# Patient Record
Sex: Female | Born: 1980 | Race: White | Hispanic: No | Marital: Single | Smoking: Never smoker
Health system: Southern US, Community
[De-identification: ages and names within clinical notes are randomized; demographics above are authoritative.]

## PROBLEM LIST (undated history)

## (undated) DIAGNOSIS — N289 Disorder of kidney and ureter, unspecified: Secondary | ICD-10-CM

---
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
## 2001-12-01 ENCOUNTER — Ambulatory Visit (HOSPITAL_BASED_OUTPATIENT_CLINIC_OR_DEPARTMENT_OTHER): Admission: RE | Admit: 2001-12-01 | Discharge: 2001-12-01 | Payer: Self-pay | Admitting: Otolaryngology

## 2002-04-20 ENCOUNTER — Other Ambulatory Visit: Admission: RE | Admit: 2002-04-20 | Discharge: 2002-04-20 | Payer: Self-pay | Admitting: Obstetrics and Gynecology

## 2002-07-05 ENCOUNTER — Ambulatory Visit (HOSPITAL_COMMUNITY): Admission: AD | Admit: 2002-07-05 | Discharge: 2002-07-05 | Payer: Self-pay | Admitting: Obstetrics and Gynecology

## 2002-07-05 ENCOUNTER — Encounter: Payer: Self-pay | Admitting: Obstetrics and Gynecology

## 2004-05-08 ENCOUNTER — Other Ambulatory Visit: Admission: RE | Admit: 2004-05-08 | Discharge: 2004-05-08 | Payer: Self-pay | Admitting: Obstetrics and Gynecology

## 2005-06-21 ENCOUNTER — Other Ambulatory Visit: Admission: RE | Admit: 2005-06-21 | Discharge: 2005-06-21 | Payer: Self-pay | Admitting: Obstetrics and Gynecology

## 2018-11-26 ENCOUNTER — Other Ambulatory Visit: Payer: Self-pay | Admitting: Family Medicine

## 2018-11-26 DIAGNOSIS — R1010 Upper abdominal pain, unspecified: Secondary | ICD-10-CM

## 2019-01-07 ENCOUNTER — Emergency Department (HOSPITAL_COMMUNITY)
Admission: EM | Admit: 2019-01-07 | Discharge: 2019-01-08 | Disposition: A | Discharge status: Home / Self Care | Payer: Managed Care, Other (non HMO) | Attending: Emergency Medicine | Admitting: Emergency Medicine

## 2019-01-07 ENCOUNTER — Encounter: Payer: Self-pay | Admitting: Emergency Medicine

## 2019-01-07 ENCOUNTER — Other Ambulatory Visit: Payer: Self-pay

## 2019-01-07 DIAGNOSIS — N200 Calculus of kidney: Secondary | ICD-10-CM | POA: Insufficient documentation

## 2019-01-07 DIAGNOSIS — Z79899 Other long term (current) drug therapy: Secondary | ICD-10-CM | POA: Diagnosis not present

## 2019-01-07 DIAGNOSIS — R109 Unspecified abdominal pain: Secondary | ICD-10-CM | POA: Diagnosis present

## 2019-01-07 HISTORY — DX: Disorder of kidney and ureter, unspecified: N28.9

## 2019-01-07 LAB — CBC WITH DIFFERENTIAL/PLATELET
Abs Immature Granulocytes: 0.05 10*3/uL (ref 0.00–0.07)
Basophils Absolute: 0 10*3/uL (ref 0.0–0.1)
Basophils Relative: 0 %
Eosinophils Absolute: 0.1 10*3/uL (ref 0.0–0.5)
Eosinophils Relative: 1 %
HCT: 43.6 % (ref 36.0–46.0)
Hemoglobin: 14.9 g/dL (ref 12.0–15.0)
Immature Granulocytes: 0 %
Lymphocytes Relative: 15 %
Lymphs Abs: 2.2 10*3/uL (ref 0.7–4.0)
MCH: 35.6 pg — ABNORMAL HIGH (ref 26.0–34.0)
MCHC: 34.2 g/dL (ref 30.0–36.0)
MCV: 104.1 fL — ABNORMAL HIGH (ref 80.0–100.0)
Monocytes Absolute: 1 10*3/uL (ref 0.1–1.0)
Monocytes Relative: 7 %
Neutro Abs: 10.8 10*3/uL — ABNORMAL HIGH (ref 1.7–7.7)
Neutrophils Relative %: 77 %
Platelets: 279 10*3/uL (ref 150–400)
RBC: 4.19 MIL/uL (ref 3.87–5.11)
RDW: 12.7 % (ref 11.5–15.5)
WBC: 14.1 10*3/uL — ABNORMAL HIGH (ref 4.0–10.5)
nRBC: 0 % (ref 0.0–0.2)

## 2019-01-07 LAB — URINALYSIS, ROUTINE W REFLEX MICROSCOPIC
Bilirubin Urine: NEGATIVE
Glucose, UA: NEGATIVE mg/dL
Ketones, ur: NEGATIVE mg/dL
Leukocytes,Ua: NEGATIVE
Nitrite: NEGATIVE
Protein, ur: NEGATIVE mg/dL
RBC / HPF: >50 RBC/hpf — ABNORMAL HIGH (ref 0–5)
Specific Gravity, Urine: 1.023 (ref 1.005–1.030)
pH: 7 (ref 5.0–8.0)

## 2019-01-07 LAB — PREGNANCY, URINE: Preg Test, Ur: NEGATIVE

## 2019-01-07 MED ORDER — KETOROLAC TROMETHAMINE 30 MG/ML IJ SOLN
30.0000 mg | Freq: Once | INTRAMUSCULAR | Status: AC
  Administered 2019-01-07: 30 mg via INTRAVENOUS
  Filled 2019-01-07: qty 1

## 2019-01-07 MED ORDER — HYDROMORPHONE HCL 1 MG/ML IJ SOLN
1.0000 mg | Freq: Once | INTRAMUSCULAR | Status: AC
  Administered 2019-01-07: 1 mg via INTRAVENOUS
  Filled 2019-01-07: qty 1

## 2019-01-07 NOTE — ED Triage Notes (Signed)
Pt complains of right flank pain since noon, she has hx of kidney stones, she tried her meds at home with no relief

## 2019-01-07 NOTE — ED Provider Notes (Signed)
[facility] DEPT Provider Note   CSN: [id number] Arrival date & time: 01/07/19  2105     History   Chief Complaint Chief Complaint  Patient presents with  . Flank Pain    HPI Rachel Monroe is a 38 y.o. female.     Patient presents to the emergency department with a chief complaint of flank pain.  Patient reports history of kidney stones.  She states that her pain began suddenly today at around noon.  She states pain is severe.  It radiates from her right flank to her right lower abdomen.  She reports associated hematuria and urinary hesitancy.  She denies any fevers, chills. She reports associated nausea and vomiting.  Nothing makes his symptoms better or worse.  She has never required intervention for kidney stones in the past.  The history is provided by the patient. No language interpreter was used.    Past Medical History:  Diagnosis Date  . Renal disorder     There are no active problems to display for this patient.   History reviewed. No pertinent surgical history.   OB History   No obstetric history on file.      Home Medications    Prior to Admission medications   Medication Sig Start Date End Date Taking? Authorizing Provider  Multiple Vitamin (MULTIVITAMIN WITH MINERALS) TABS tablet Take 1 tablet by mouth daily.   Yes [provider]  oxyCODONE (ROXICODONE) 5 MG immediate release tablet Take 5-10 mg by mouth every 4 (four) hours as needed for moderate pain or severe pain.   Yes [provider]    Family History History reviewed. No pertinent family history.  Social History Social History   Tobacco Use  . Smoking status: Never Smoker  . Smokeless tobacco: Never Used  Substance Use Topics  . Alcohol use: Never    Frequency: Never  . Drug use: Never     Allergies   Patient has no known allergies.   Review of Systems Review of Systems  All other systems reviewed and are negative.     Physical Exam Updated Vital Signs BP (!) 132/99 (BP Location: Left Arm)   Pulse 88   Resp 20   LMP 12/27/2018   SpO2 95%   Physical Exam Vitals signs and nursing note reviewed.  Constitutional:      General: She is not in acute distress.    Appearance: She is well-developed.  HENT:     Head: Normocephalic and atraumatic.  Eyes:     Conjunctiva/sclera: Conjunctivae normal.  Neck:     Musculoskeletal: Neck supple.  Cardiovascular:     Rate and Rhythm: Normal rate and regular rhythm.     Heart sounds: No murmur.  Pulmonary:     Effort: Pulmonary effort is normal. No respiratory distress.     Breath sounds: Normal breath sounds.  Abdominal:     Palpations: Abdomen is soft.     Tenderness: There is no abdominal tenderness.  Skin:    General: Skin is warm and dry.  Neurological:     Mental Status: She is alert and oriented to person, place, and time.  Psychiatric:        Mood and Affect: Mood normal.        Behavior: Behavior normal.      ED Treatments / Results  Labs (all labs ordered are listed, but only abnormal results are displayed) Labs Reviewed  URINALYSIS, ROUTINE W REFLEX MICROSCOPIC - Abnormal; Notable for  the following components:      Result Value   APPearance HAZY (*)    Hgb urine dipstick MODERATE (*)    RBC / HPF >50 (*)    Bacteria, UA RARE (*)    All other components within normal limits  PREGNANCY, URINE  CBC WITH DIFFERENTIAL/PLATELET  BASIC METABOLIC PANEL    EKG None  Radiology No results found.  Procedures Procedures (including critical care time)  Medications Ordered in ED Medications  ketorolac (TORADOL) 30 MG/ML injection 30 mg (has no administration in time range)  HYDROmorphone (DILAUDID) injection 1 mg (has no administration in time range)     Initial Impression / Assessment and Plan / ED Course  I have reviewed the triage vital signs and the nursing notes.  Pertinent labs & imaging results that were available during my  care of the patient were reviewed by me and considered in my medical decision making (see chart for details).        Patient with history of kidney stones, reports right-sided flank pain, onset around noon today.  Urinalysis shows gross hematuria.  High suspicion for kidney stone.  Will check creatinine and CT renal.  Will treat pain.  Will reassess.  CT shows an 8 mm UVJ stone with forniceal rupture and some perinephric fluid and hydro-ureteronephrosis.  I discussed the case with Dr. Mena GoesEskridge, who recommends outpatient follow-up.  Will discharge home with pain medicine and antibiotics due to her leukocytosis.  As discussed with Dr. Nicanor AlconPalumbo, who agrees with the plan.  Final Clinical Impressions(s) / ED Diagnoses   Final diagnoses:  Kidney stone    ED Discharge Orders         Ordered    HYDROcodone-acetaminophen (NORCO/VICODIN) 5-325 MG tablet  Every 6 hours PRN     01/08/19 0142    ondansetron (ZOFRAN ODT) 4 MG disintegrating tablet  Every 8 hours PRN     01/08/19 0142    cephALEXin (KEFLEX) 500 MG capsule  2 times daily     01/08/19 0142           Roxy HorsemanBrowning, Christalynn [place]ise, PA-C 01/08/19 0143    Palumbo, April, MD 01/08/19 [id number]

## 2019-01-08 ENCOUNTER — Emergency Department (HOSPITAL_COMMUNITY): Payer: Managed Care, Other (non HMO)

## 2019-01-08 LAB — BASIC METABOLIC PANEL
Anion gap: 16 — ABNORMAL HIGH (ref 5–15)
BUN: 24 mg/dL — ABNORMAL HIGH (ref 6–20)
CO2: 25 mmol/L (ref 22–32)
Calcium: 9.7 mg/dL (ref 8.9–10.3)
Chloride: 97 mmol/L — ABNORMAL LOW (ref 98–111)
Creatinine, Ser: 1.29 mg/dL — ABNORMAL HIGH (ref 0.44–1.00)
GFR calc Af Amer: >60 mL/min (ref >60)
GFR calc non Af Amer: 52 mL/min — ABNORMAL LOW (ref >60)
Glucose, Bld: 123 mg/dL — ABNORMAL HIGH (ref 70–99)
Potassium: 3.5 mmol/L (ref 3.5–5.1)
Sodium: 138 mmol/L (ref 135–145)

## 2019-01-08 MED ORDER — ONDANSETRON 4 MG PO TBDP: 4.0000 mg | ORAL_TABLET | Freq: Three times a day (TID) | ORAL | 0 refills | Status: AC | PRN

## 2019-01-08 MED ORDER — HYDROCODONE-ACETAMINOPHEN 5-325 MG PO TABS
1.0000 | ORAL_TABLET | Freq: Once | ORAL | Status: AC
  Administered 2019-01-08: 02:00:00 1 via ORAL
  Filled 2019-01-08: qty 1

## 2019-01-08 MED ORDER — CEPHALEXIN 500 MG PO CAPS: 500.0000 mg | ORAL_CAPSULE | Freq: Two times a day (BID) | ORAL | 0 refills | Status: AC

## 2019-01-08 MED ORDER — HYDROCODONE-ACETAMINOPHEN 5-325 MG PO TABS: 1.0000 | ORAL_TABLET | Freq: Four times a day (QID) | ORAL | 0 refills | Status: AC | PRN

## 2019-01-08 NOTE — ED Notes (Signed)
Pt at CT

## 2020-10-15 IMAGING — CT CT RENAL STONE PROTOCOL
2 of 4 series · 15 of 46 positions shown, 17 images · non-contrast
Comparison: None.

CLINICAL DATA: Right flank pain, microhematuria, elevated white
blood cell count

EXAM:
CT ABDOMEN AND PELVIS WITHOUT CONTRAST
TECHNIQUE: Multidetector CT imaging of the abdomen and pelvis was performed
following the standard protocol without IV contrast.

[Series 2: axial st · axial · 0.70mm/px · z∈[+1102,+1512]mm · 12 of 92 slices shown, 14 images]
[im 5/92  soft-tissue]
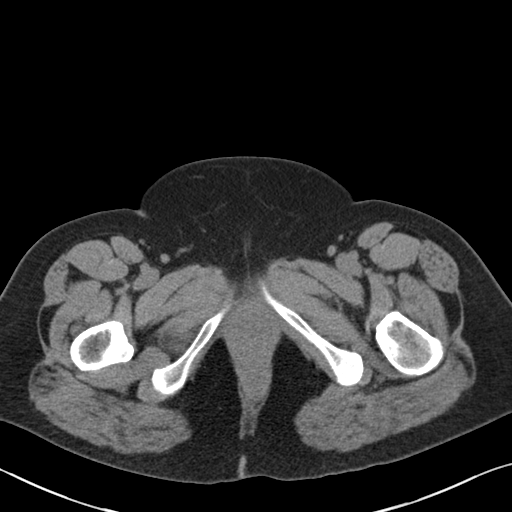
[im 5/92  bone]
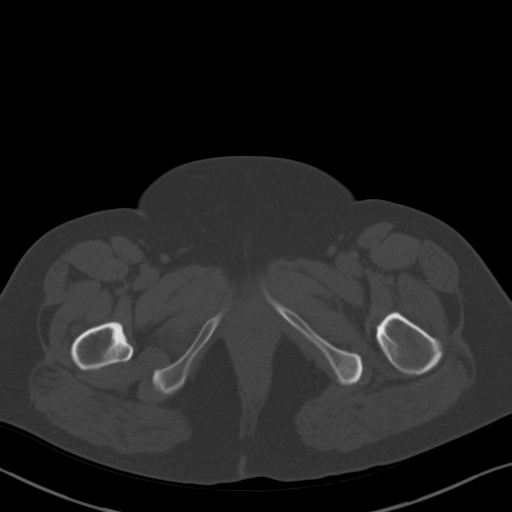
[im 14/92  soft-tissue]
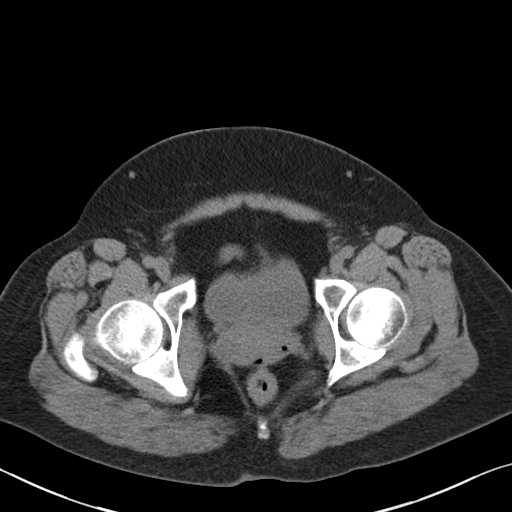
[im 19/92  soft-tissue]
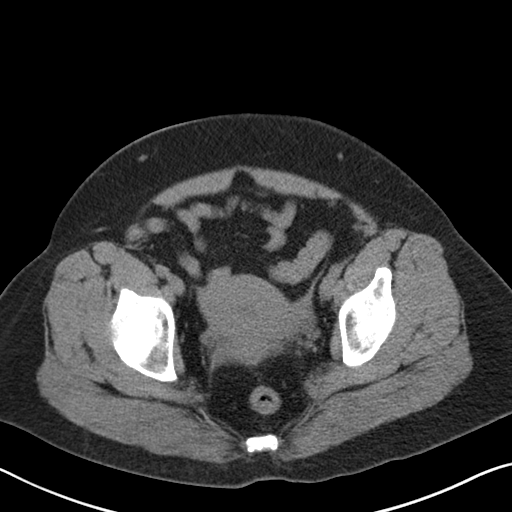
[im 28/92  soft-tissue]
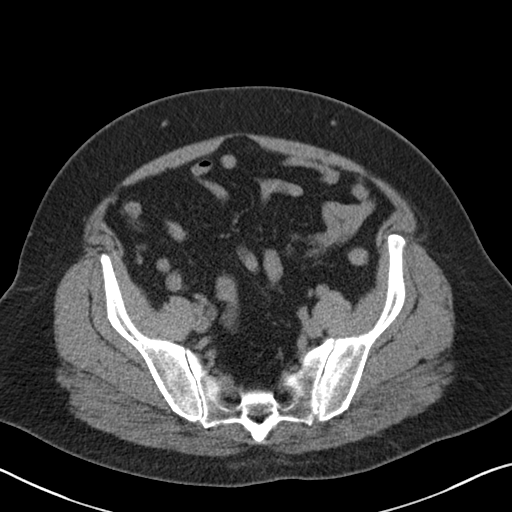
[im 37/92  soft-tissue]
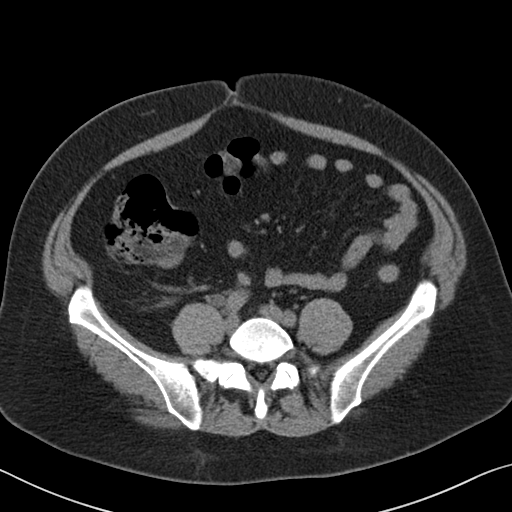
[im 41/92  soft-tissue]
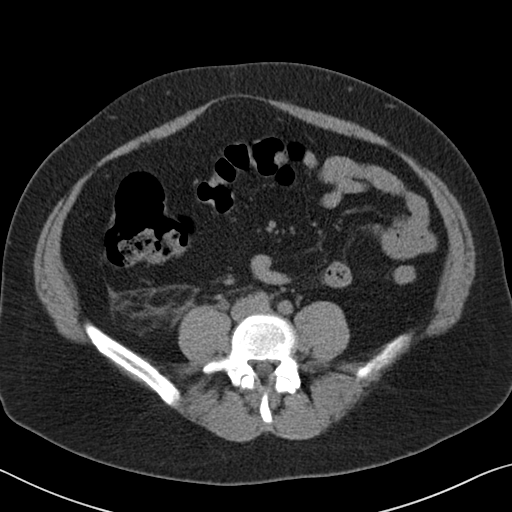
[im 51/92  soft-tissue]
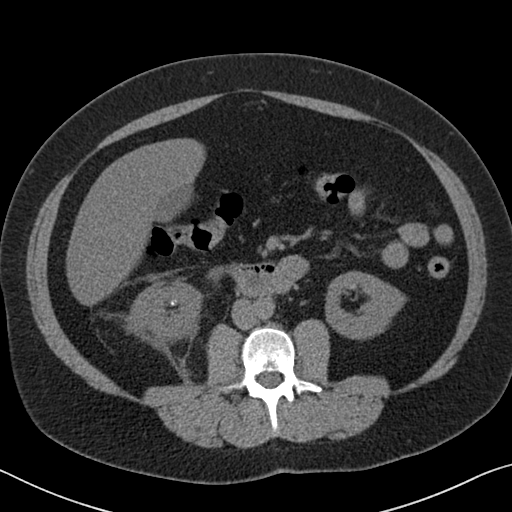
[im 55/92  soft-tissue]
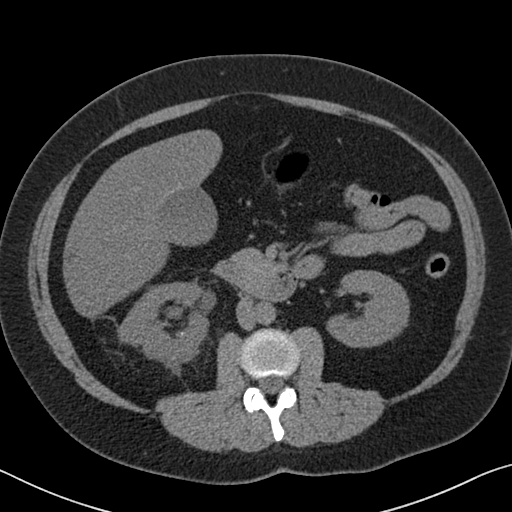
[im 64/92  soft-tissue]
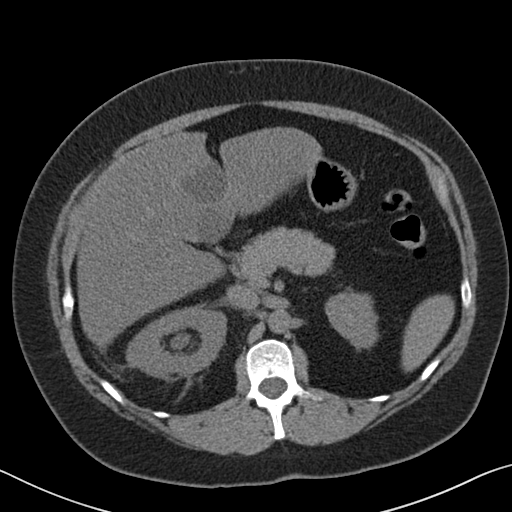
[im 64/92  bone]
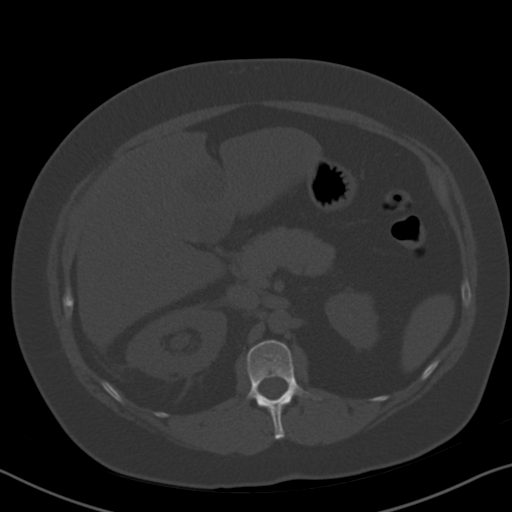
[im 73/92  soft-tissue]
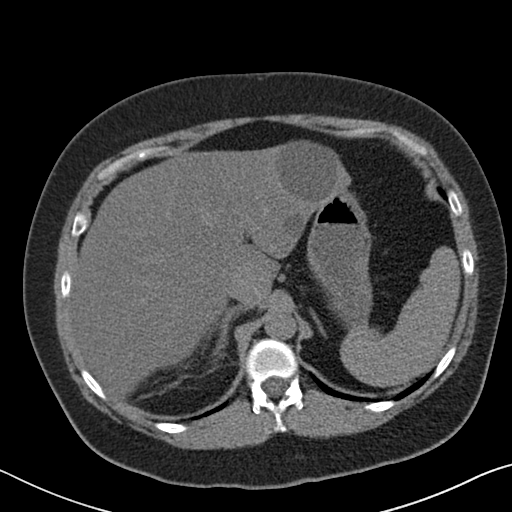
[im 78/92  soft-tissue]
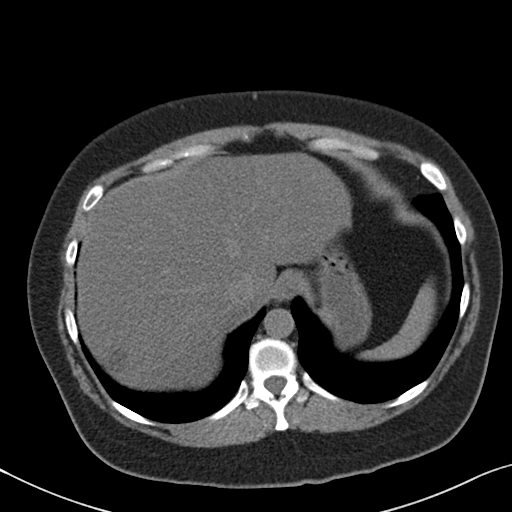
[im 87/92  soft-tissue]
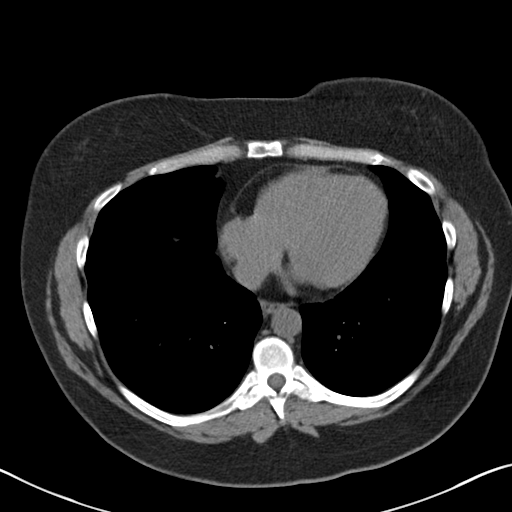

[Series 5: coronal · coronal · 0.69mm/px · 3 of 146 slices shown]
[im 49/146  soft-tissue]
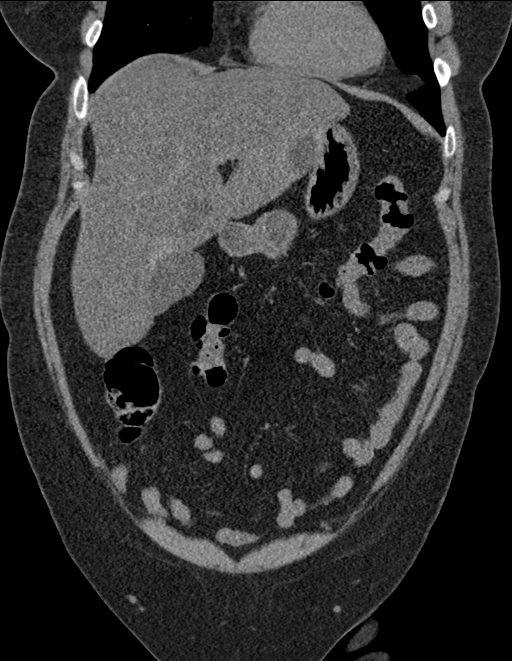
[im 65/146  soft-tissue]
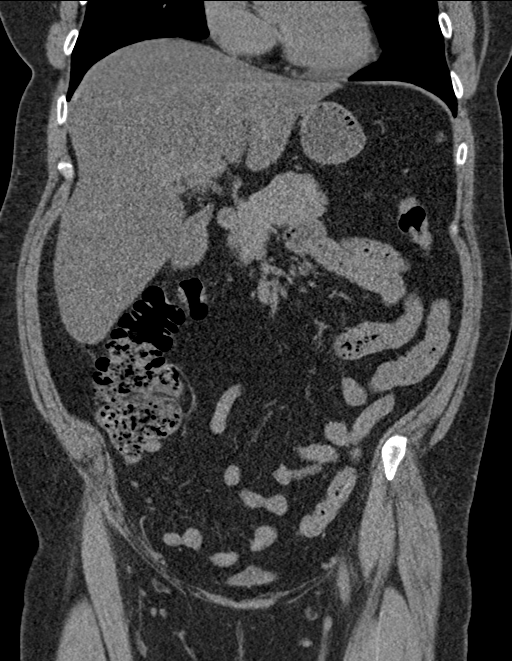
[im 81/146  soft-tissue]
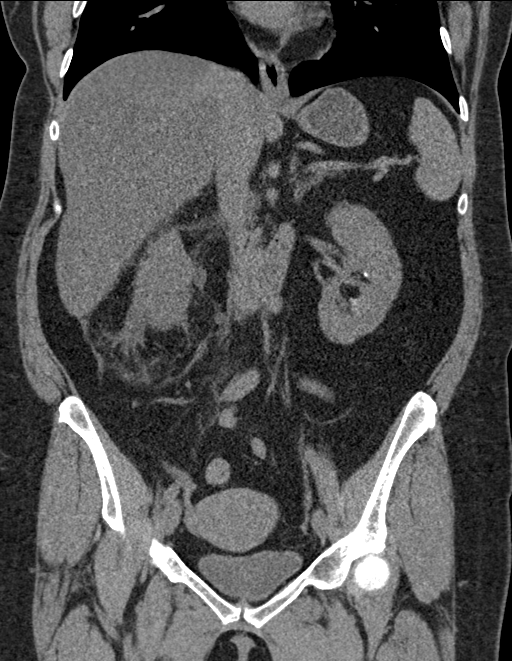

[15 of 46 positions shown; findings below may reference images not displayed]

FINDINGS: Lower chest: Lung bases are clear. Normal heart size. No pericardial
effusion.

Hepatobiliary: Diffuse hepatic hypoattenuation compatible with
hepatic steatosis. Areas of focal fatty sparing are seen along the
gallbladder fossa and the periphery of the multiple simple
attenuation (5-12 Hounsfield units) cystic lesions in the liver.
Additional subcentimeter hypoattenuating foci are too small to fully
characterize on CT imaging but statistically likely benign. No other
visible or concerning hepatic lesions. Gallbladder is unremarkable.
No visible calcified gallstones. No biliary ductal dilatation.

Pancreas: Unremarkable. No pancreatic ductal dilatation or
surrounding inflammatory changes.

Spleen: Normal in size without focal abnormality.

Adrenals/Urinary Tract: Normal adrenal glands.

Stomach/Bowel: Asymmetric enlargement of the right kidney extensive
perinephric stranding, small volume perinephric fluid and moderate
hydroureteronephrosis to the level of a 8 mm calculus just proximal
to the right ureterovesicular junction. Additional bilateral
nonobstructing nephrolithiasis is seen. No obstructing left
urolithiasis. Urinary bladder is largely decompressed at the time of
exam and therefore poorly evaluated by CT imaging.

Vascular/Lymphatic: No significant vascular findings are present. No
enlarged abdominal or pelvic lymph nodes.

Reproductive: Anteverted, mildly retroflexed uterus. No concerning
adnexal lesions.

Other: Asymmetric right perinephric stranding, as above.

Musculoskeletal: No acute osseous abnormality or suspicious osseous
lesion.
IMPRESSION: 1. Moderate right hydroureteronephrosis to the level of a 8 mm
calculus just proximal to the right ureterovesicular junction.
Asymmetric right perinephric stranding and small volume right
perinephric fluid, could reflect a forniceal rupture as the result
of obstruction.
2. Additional bilateral nonobstructing nephrolithiasis.
3. Hepatic steatosis.
4. Multiple fluid attenuation hepatic cysts throughout the liver. In
the absence of risk factors no further imaging is required. This
recommendation follows ACR consensus guidelines: Management of
Incidental Liver Lesions on CT: A White Paper of the ACR Incidental
Findings Committee. [HOSPITAL] 7032; 14:[id number].
